# Patient Record
Sex: Female | Born: 1970 | Race: White | Hispanic: No | Smoking: Never smoker
Health system: Southern US, Community
[De-identification: ages and names within clinical notes are randomized; demographics above are authoritative.]

---

## 2001-07-29 ENCOUNTER — Inpatient Hospital Stay (HOSPITAL_COMMUNITY): Admission: AD | Admit: 2001-07-29 | Discharge: 2001-08-02 | Payer: Self-pay | Admitting: Obstetrics and Gynecology

## 2001-09-28 ENCOUNTER — Other Ambulatory Visit: Admission: RE | Admit: 2001-09-28 | Discharge: 2001-09-28 | Payer: Self-pay | Admitting: Obstetrics and Gynecology

## 2002-11-25 ENCOUNTER — Other Ambulatory Visit: Admission: RE | Admit: 2002-11-25 | Discharge: 2002-11-25 | Payer: Self-pay | Admitting: Obstetrics and Gynecology

## 2004-02-07 ENCOUNTER — Other Ambulatory Visit: Admission: RE | Admit: 2004-02-07 | Discharge: 2004-02-07 | Payer: Self-pay | Admitting: Obstetrics and Gynecology

## 2005-05-05 ENCOUNTER — Other Ambulatory Visit: Admission: RE | Admit: 2005-05-05 | Discharge: 2005-05-05 | Payer: Self-pay | Admitting: Obstetrics and Gynecology

## 2011-09-21 ENCOUNTER — Ambulatory Visit (INDEPENDENT_AMBULATORY_CARE_PROVIDER_SITE_OTHER): Payer: 59

## 2011-09-21 DIAGNOSIS — Z0000 Encounter for general adult medical examination without abnormal findings: Secondary | ICD-10-CM

## 2011-10-19 ENCOUNTER — Ambulatory Visit (INDEPENDENT_AMBULATORY_CARE_PROVIDER_SITE_OTHER): Payer: 59

## 2011-10-19 DIAGNOSIS — Z719 Counseling, unspecified: Secondary | ICD-10-CM

## 2011-11-03 ENCOUNTER — Telehealth: Payer: Self-pay

## 2011-11-03 NOTE — Telephone Encounter (Signed)
.  [facility] PT WOULD LIKE TO SPEAK WITH DR DOO OR HIS NURSE REGARDING HER MEDS PLEASE CALL [phone]

## 2011-11-04 NOTE — Telephone Encounter (Signed)
CALLED PT LMOM TO C/B

## 2011-11-06 NOTE — Telephone Encounter (Signed)
LMOM TO CB 

## 2011-11-07 NOTE — Telephone Encounter (Signed)
Sent unable to reach letter.

## 2011-11-07 NOTE — Telephone Encounter (Signed)
LMOM TO CB 

## 2011-11-13 ENCOUNTER — Telehealth: Payer: Self-pay

## 2011-11-13 NOTE — Telephone Encounter (Signed)
.  [facility] PT HAD SPOKEN WITH A CLINICAL NURSE THE OTHER DAY AND STILL HAVE QUESTIONS TO ASK PLEASE CALL [phone]

## 2011-11-15 NOTE — Telephone Encounter (Signed)
LMOM to CB. 

## 2011-11-18 NOTE — Telephone Encounter (Signed)
Spoke with pt who had just wanted to check and make sure that she could take OTC cold meds with the acyclovir she is on. Pt stated she had called the pharmacist and he told her it was safe.

## 2012-04-16 ENCOUNTER — Ambulatory Visit (INDEPENDENT_AMBULATORY_CARE_PROVIDER_SITE_OTHER): Payer: 59 | Admitting: Internal Medicine

## 2012-04-16 VITALS — BP 134/80 | HR 75 | Temp 98.7°F | Resp 16 | Ht 67.0 in | Wt 287.0 lb

## 2012-04-16 DIAGNOSIS — R3915 Urgency of urination: Secondary | ICD-10-CM

## 2012-04-16 DIAGNOSIS — B9689 Other specified bacterial agents as the cause of diseases classified elsewhere: Secondary | ICD-10-CM

## 2012-04-16 DIAGNOSIS — R102 Pelvic and perineal pain: Secondary | ICD-10-CM

## 2012-04-16 DIAGNOSIS — N949 Unspecified condition associated with female genital organs and menstrual cycle: Secondary | ICD-10-CM

## 2012-04-16 DIAGNOSIS — N760 Acute vaginitis: Secondary | ICD-10-CM

## 2012-04-16 LAB — POCT URINALYSIS DIPSTICK
Bilirubin, UA: NEGATIVE
Blood, UA: NEGATIVE
Glucose, UA: NEGATIVE
Ketones, UA: NEGATIVE
Leukocytes, UA: NEGATIVE
Nitrite, UA: POSITIVE
Protein, UA: NEGATIVE
Spec Grav, UA: >=1.03
Urobilinogen, UA: 0.2
pH, UA: 5.5

## 2012-04-16 LAB — POCT UA - MICROSCOPIC ONLY
Casts, Ur, LPF, POC: NEGATIVE
Crystals, Ur, HPF, POC: NEGATIVE
Mucus, UA: POSITIVE
WBC, Ur, HPF, POC: NEGATIVE
Yeast, UA: NEGATIVE

## 2012-04-16 LAB — POCT WET PREP WITH KOH
KOH Prep POC: NEGATIVE
Yeast Wet Prep HPF POC: NEGATIVE

## 2012-04-16 MED ORDER — FLUCONAZOLE 150 MG PO TABS: 150.0000 mg | ORAL_TABLET | Freq: Once | ORAL | Status: AC

## 2012-04-16 MED ORDER — CIPROFLOXACIN HCL 500 MG PO TABS: 500.0000 mg | ORAL_TABLET | Freq: Two times a day (BID) | ORAL | Status: DC

## 2012-04-16 MED ORDER — METRONIDAZOLE 500 MG PO TABS: 500.0000 mg | ORAL_TABLET | Freq: Two times a day (BID) | ORAL | Status: AC

## 2012-04-16 NOTE — Progress Notes (Signed)
Subjective:    Patient ID: Danielle Butler, female    DOB: [birth year], 41 y.o.   MRN: [id number]  HPIComplaining of pelvic discomfort with urinary urgency and some post void discomfort/no vaginal discharge but feels raw around the labia History of herpes on prophylaxis without new lesions Same partner for the last several months at least since last visit He travels and over the weekend before the symptoms started they had several episodes of intercourse which were prolonged This was her first weekend for a protective intercourse with this partner   Review of SystemsNo fever or chills No back pain No nausea or vomiting      Objective:   Physical Exam In no acute distress Abdomen exam benign Introitus clear though inner labia are hyperemic White vaginal discharge Cervical os clear and nontender to manipulation Uterus anterior and mildly tender No adnexal tenderness or masses       Results for orders placed in visit on 04/16/12  POCT UA - MICROSCOPIC ONLY      Component Value Range   WBC, Ur, HPF, POC neg     RBC, urine, microscopic 0-2     Bacteria, U Microscopic trace     Mucus, UA pos     Epithelial cells, urine per micros 0-1     Crystals, Ur, HPF, POC neg     Casts, Ur, LPF, POC neg'     Yeast, UA neg    POCT URINALYSIS DIPSTICK      Component Value Range   Color, UA orange     Clarity, UA clear     Glucose, UA neg     Bilirubin, UA neg     Ketones, UA neg     Spec Grav, UA >=1.030     Blood, UA neg     pH, UA 5.5     Protein, UA neg     Urobilinogen, UA 0.2     Nitrite, UA pos     Leukocytes, UA Negative     Results for orders placed in visit on 04/16/12  POCT UA - MICROSCOPIC ONLY      Component Value Range   WBC, Ur, HPF, POC neg     RBC, urine, microscopic 0-2     Bacteria, U Microscopic trace     Mucus, UA pos     Epithelial cells, urine per micros 0-1     Crystals, Ur, HPF, POC neg     Casts, Ur, LPF, POC neg'     Yeast, UA neg    POCT  URINALYSIS DIPSTICK      Component Value Range   Color, UA orange     Clarity, UA clear     Glucose, UA neg     Bilirubin, UA neg     Ketones, UA neg     Spec Grav, UA >=1.030     Blood, UA neg     pH, UA 5.5     Protein, UA neg     Urobilinogen, UA 0.2     Nitrite, UA pos     Leukocytes, UA Negative    POCT WET PREP WITH KOH      Component Value Range   Trichomonas, UA Negative     Clue Cells Wet Prep HPF POC 6-15     Epithelial Wet Prep HPF POC 4-6     Yeast Wet Prep HPF POC neg     Bacteria Wet Prep HPF POC 2+     RBC Wet Prep  HPF POC 3-8     WBC Wet Prep HPF POC 0-4     KOH Prep POC Negative      Assessment & Plan:  Problem #1 nitrite positive suggesting UTI in a woman with recurrent UTI Problem #2 nonspecific vaginitis Problem #3 unprotected intercourse with partner who travels 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 acyclovir (ZOVIRAX) 200 MG capsule    Sig: Take by mouth 2 (two) times daily.  . ciprofloxacin (CIPRO) 500 MG tablet    Sig: Take 1 tablet (500 mg total) by mouth 2 (two) times daily.    Dispense:  20 tablet    Refill:  0  . metroNIDAZOLE (FLAGYL) 500 MG tablet    Sig: Take 1 tablet (500 mg total) by mouth 2 (two) times daily.    Dispense:  14 tablet    Refill:  0  . fluconazole (DIFLUCAN) 150 MG tablet    Sig: Take 1 tablet (150 mg total) by mouth once.    Dispense:  1 tablet    Refill:  1   Ok to call For refill of acyclovir /She has his prescription Call with results

## 2012-04-19 ENCOUNTER — Telehealth: Payer: Self-pay

## 2012-04-19 NOTE — Telephone Encounter (Signed)
Pt would like to know if lab results from her visit on Friday are back in yet.  Best# [phone]

## 2012-04-20 ENCOUNTER — Telehealth: Payer: Self-pay

## 2012-04-20 NOTE — Telephone Encounter (Signed)
Pt states she is still having stomach pain after being on medication for uti since Friday, and would like to know when she will be feeling better or should she be concerned about her stomach pian.

## 2012-04-20 NOTE — Telephone Encounter (Signed)
Called patient to advise, she will finish her medications if she does not resolve completely she will call back.

## 2012-04-20 NOTE — Telephone Encounter (Signed)
Called her to advise she should be improving with this, needs to be seen if still painful. She is planning to go out of town this weekend and will come in today or tomorrow

## 2012-04-21 ENCOUNTER — Ambulatory Visit (INDEPENDENT_AMBULATORY_CARE_PROVIDER_SITE_OTHER): Payer: 59 | Admitting: Family Medicine

## 2012-04-21 VITALS — BP 128/74 | HR 68 | Temp 97.7°F | Resp 16 | Ht 66.0 in | Wt 289.0 lb

## 2012-04-21 DIAGNOSIS — N3090 Cystitis, unspecified without hematuria: Secondary | ICD-10-CM

## 2012-04-21 DIAGNOSIS — R109 Unspecified abdominal pain: Secondary | ICD-10-CM

## 2012-04-21 LAB — POCT URINALYSIS DIPSTICK
Blood, UA: NEGATIVE
Ketones, UA: NEGATIVE
Protein, UA: NEGATIVE
Spec Grav, UA: >=1.03
pH, UA: 6.5

## 2012-04-21 LAB — POCT CBC
Granulocyte percent: 68.3 %G (ref 37–80)
HCT, POC: 42 % (ref 37.7–47.9)
Hemoglobin: 13 g/dL (ref 12.2–16.2)
MCV: 88.9 fL (ref 80–97)
MID (cbc): 0.5 (ref 0–0.9)
Platelet Count, POC: 180 10*3/uL (ref 142–424)
RBC: 4.73 M/uL (ref 4.04–5.48)

## 2012-04-21 LAB — POCT UA - MICROSCOPIC ONLY
Casts, Ur, LPF, POC: NEGATIVE
Mucus, UA: POSITIVE
Yeast, UA: NEGATIVE

## 2012-04-21 LAB — POCT URINE PREGNANCY: Preg Test, Ur: NEGATIVE

## 2012-04-21 MED ORDER — NITROFURANTOIN MONOHYD MACRO 100 MG PO CAPS: 100.0000 mg | ORAL_CAPSULE | Freq: Two times a day (BID) | ORAL | Status: AC

## 2012-04-21 NOTE — Progress Notes (Signed)
[facility] [address] [place] [address] [phone]  Date:  04/21/2012   Name:  Danielle Butler   DOB:  March 18, 1971   MRN:  [id number]  PCP:  No primary provider on file.    Chief Complaint: Cystitis   History of Present Illness:  Danielle Butler is a 41 y.o. very pleasant female patient who presents with the following:  She was here on 7/26 with complaint of urinary and vaginal discomfort.  History of HSV.  She was treated for possible UTI and vaginitis with acyclovir, cipro, diflucan and flagyl.  She had a negative genprobe and negative urine culture.    Yesterday she noted lower abdominal and lower back pain, as well as some urinary pressure.  She has been drinking fluids.   No nausea or vomiting- except Friday she felt ill after taking her medication, she has had some diarrhea.  No fever.   Her menses are due soon- she wonders if this could be the source of her discomfort.  Her vaginal area still feels irritated- she has taken one diflucan and has one more to take in a few days.  She is mid- way through her flagyl and cipro.    LMP 03/26/12  There is no problem list on file for this patient.   No past medical history on file.  No past surgical history on file.  History  Substance Use Topics  . Smoking status: Never Smoker   . Smokeless tobacco: Not on file  . Alcohol Use: Not on file    No family history on file.  No Known Allergies  Medication list has been reviewed and updated.  Current Outpatient Prescriptions on File Prior to Visit  Medication Sig Dispense Refill  . acyclovir (ZOVIRAX) 200 MG capsule Take by mouth 2 (two) times daily.      . ciprofloxacin (CIPRO) 500 MG tablet Take 1 tablet (500 mg total) by mouth 2 (two) times daily.  20 tablet  0  . metroNIDAZOLE (FLAGYL) 500 MG tablet Take 1 tablet (500 mg total) by mouth 2 (two) times daily.  14 tablet  0    Review of Systems:  As per HPI- otherwise negative.   Physical  Examination: Filed Vitals:   04/21/12 0918  BP: 128/74  Pulse: 68  Temp: 97.7 F (36.5 C)  Resp: 16   Filed Vitals:   04/21/12 0918  Height: 5\' 6"  (1.676 m)  Weight: 289 lb (131.09 kg)   Body mass index is 46.65 kg/(m^2). Ideal Body Weight: Weight in (lb) to have BMI = 25: 154.6   GEN: WDWN, NAD, Non-toxic, A & O x 3, obese HEENT: Atraumatic, Normocephalic. Neck supple. No masses, No LAD. Ears and Nose: No external deformity. CV: RRR, No M/G/R. No JVD. No thrill. No extra heart sounds. PULM: CTA B, no wheezes, crackles, rhonchi. No retractions. No resp. distress. No accessory muscle use. ABD: S, NT, ND, +BS. No rebound. No HSM.  No CVA tenderness EXTR: No c/c/e NEURO Normal gait.  PSYCH: Normally interactive. Conversant. Not depressed or anxious appearing.  Calm demeanor.  Gu: normal external and internal exam.  No CMT, no adnexal tenderness.  No abnormal discharge.     Results for orders placed in visit on 04/21/12  POCT URINALYSIS DIPSTICK      Component Value Range   Color, UA dark yellow     Clarity, UA clear     Glucose, UA neg     Bilirubin,  UA neg     Ketones, UA neg     Spec Grav, UA >=1.030     Blood, UA neg     pH, UA 6.5     Protein, UA neg     Urobilinogen, UA 0.2     Nitrite, UA neg     Leukocytes, UA Negative    POCT UA - MICROSCOPIC ONLY      Component Value Range   WBC, Ur, HPF, POC 2-6     RBC, urine, microscopic 6-20     Bacteria, U Microscopic 3+     Mucus, UA pos     Epithelial cells, urine per micros 5-6     Crystals, Ur, HPF, POC calcium oxalate     Casts, Ur, LPF, POC neg     Yeast, UA neg    POCT URINE PREGNANCY      Component Value Range   Preg Test, Ur Negative    POCT CBC      Component Value Range   WBC 7.4  4.6 - 10.2 K/uL   Lymph, poc 1.9  0.6 - 3.4   POC LYMPH PERCENT 25.4  10 - 50 %L   MID (cbc) 0.5  0 - 0.9   POC MID % 6.3  0 - 12 %M   POC Granulocyte 5.1  2 - 6.9   Granulocyte percent 68.3  37 - 80 %G   RBC 4.73  4.04  - 5.48 M/uL   Hemoglobin 13.0  12.2 - 16.2 g/dL   HCT, POC 40.9  81.1 - 47.9 %   MCV 88.9  80 - 97 fL   MCH, POC 27.5  27 - 31.2 pg   MCHC 31.0 (*) 31.8 - 35.4 g/dL   RDW, POC 91.4     Platelet Count, POC 180  142 - 424 K/uL   MPV 11.5  0 - 99.8 fL    Assessment and Plan: 1. Bladder infection  POCT urinalysis dipstick, POCT UA - Microscopic Only, nitrofurantoin, macrocrystal-monohydrate, (MACROBID) 100 MG capsule, Urine culture  2. Abdominal  pain, other specified site  POCT urine pregnancy, POCT CBC   Koni continues to have bacteria in her urine.  Possible false negative urine culture?  Will D/C cipro and start macrobid, await repeat culture.  Normal CBC reassuring that she has not abdominal infection.  Let me know if not better within 2 days- Sooner if worse.     Abbe Amsterdam, MD

## 2012-04-21 NOTE — Patient Instructions (Signed)
Stop taking the cipro- we are going to use macrobid (nitrofuratoin) instead. Let me know if you are feeling worse or if you are not better in the next couple of days

## 2012-04-23 LAB — URINE CULTURE: Organism ID, Bacteria: NO GROWTH

## 2012-04-24 ENCOUNTER — Encounter: Payer: Self-pay | Admitting: Family Medicine

## 2012-04-24 ENCOUNTER — Telehealth: Payer: Self-pay

## 2012-04-24 NOTE — Telephone Encounter (Signed)
Message for dr copland  Pt returned call to say that she is feeling much better and thank you!

## 2012-05-11 ENCOUNTER — Telehealth: Payer: Self-pay

## 2012-05-11 NOTE — Telephone Encounter (Signed)
Danielle Butler is saying that dr copland had put her on macrobid, she said this works and has been in several times for her problem. It is going to be hard for her to come in again and would like to know what she should do? Would like a nurse to call her if possible [phone]

## 2012-05-11 NOTE — Telephone Encounter (Signed)
Assessment and Plan:  1.  Bladder infection  POCT urinalysis dipstick, POCT UA - Microscopic Only, nitrofurantoin, macrocrystal-monohydrate, (MACROBID) 100 MG capsule, Urine culture   2.  Abdominal pain, other specified site  POCT urine pregnancy, POCT CBC    Danielle Butler continues to have bacteria in her urine. Possible false negative urine culture? Will D/C cipro and start macrobid, await repeat culture. Normal CBC reassuring that she has not abdominal infection. Let me know if not better within 2 days- Sooner if worse.  Abbe Amsterdam, MD    From 04/21/12 office visit, please advise what to do now, patients symptoms have returned after finishing Macrobid Rx.

## 2012-05-12 NOTE — Telephone Encounter (Signed)
Please get details about what is going on now.

## 2012-05-12 NOTE — Telephone Encounter (Signed)
Pt reports that she really felt like the Macrobid was clearing up her infection but bf Sxs completely resolved she started her menstral cycle and it was hard for her to tell if she still had Sxs. Now that her cycle stopped a couple of days ago, she is definitely starting to feel the pressure and more freq urination. Urine still looks clear and light bc she has been drinking a lot of water. Pt was also started on Ortho Tri cyclin at same time by her GYN to control length of cycles. Pt thinks that she just didn't stay on the Macrobid quite long enough bc it was definitely helping. Dr Patsy Lager do you want to Rx another round? Also, we Rxd Diflucan last time bc she is prone to yeast infs w/Abxs.

## 2012-05-13 ENCOUNTER — Telehealth: Payer: Self-pay | Admitting: Family Medicine

## 2012-05-13 DIAGNOSIS — R350 Frequency of micturition: Secondary | ICD-10-CM

## 2012-05-13 MED ORDER — NITROFURANTOIN MONOHYD MACRO 100 MG PO CAPS: 100.0000 mg | ORAL_CAPSULE | Freq: Two times a day (BID) | ORAL | Status: AC

## 2012-05-13 MED ORDER — FLUCONAZOLE 150 MG PO TABS: 150.0000 mg | ORAL_TABLET | Freq: Once | ORAL | Status: AC

## 2012-05-13 NOTE — Telephone Encounter (Signed)
Called her back- I am so sorry that I did not get to call her back until today.  She again has urinary urgency and frequency.  Explained that her past urine cultures were both negative.  However, she did feel that the macrobid helped.  She would like to try taking this again.  This is ok, but explained that she does need to follow- up if her symptoms persist after this treatment.  I will send macrobid and diflucan to her drug store.    She is not having any nausea, vomiting, fever, or other worrisome symptoms.

## 2012-05-17 ENCOUNTER — Encounter: Payer: Self-pay | Admitting: Family Medicine

## 2012-06-18 ENCOUNTER — Telehealth: Payer: Self-pay

## 2012-06-18 ENCOUNTER — Other Ambulatory Visit: Payer: Self-pay | Admitting: Internal Medicine

## 2012-06-18 NOTE — Telephone Encounter (Signed)
Patient would like refill of    acyclovir (ZOVIRAX) 200 MG capsule [[id number]]    Best [phone]  [facility] [address], [place] - [address]

## 2012-06-18 NOTE — Telephone Encounter (Signed)
I have already sent this.

## 2012-06-19 NOTE — Telephone Encounter (Signed)
lmom that rx was sent in

## 2012-09-22 ENCOUNTER — Ambulatory Visit (INDEPENDENT_AMBULATORY_CARE_PROVIDER_SITE_OTHER): Payer: 59 | Admitting: Internal Medicine

## 2012-09-22 VITALS — BP 132/82 | HR 101 | Temp 98.4°F | Resp 17 | Ht 66.5 in | Wt 299.0 lb

## 2012-09-22 DIAGNOSIS — J019 Acute sinusitis, unspecified: Secondary | ICD-10-CM

## 2012-09-22 DIAGNOSIS — Z0000 Encounter for general adult medical examination without abnormal findings: Secondary | ICD-10-CM

## 2012-09-22 DIAGNOSIS — Z6841 Body Mass Index (BMI) 40.0 and over, adult: Secondary | ICD-10-CM

## 2012-09-22 DIAGNOSIS — E785 Hyperlipidemia, unspecified: Secondary | ICD-10-CM | POA: Insufficient documentation

## 2012-09-22 DIAGNOSIS — B009 Herpesviral infection, unspecified: Secondary | ICD-10-CM | POA: Insufficient documentation

## 2012-09-22 LAB — POCT CBC
Lymph, poc: 2.5 (ref 0.6–3.4)
MCH, POC: 30 pg (ref 27–31.2)
MCV: 94.1 fL (ref 80–97)
MID (cbc): 0.7 (ref 0–0.9)
Platelet Count, POC: 211 10*3/uL (ref 142–424)
RBC: 4.84 M/uL (ref 4.04–5.48)
WBC: 10.7 10*3/uL — AB (ref 4.6–10.2)

## 2012-09-22 LAB — LIPID PANEL
HDL: 62 mg/dL (ref >39)
LDL Cholesterol: 168 mg/dL — ABNORMAL HIGH (ref 0–99)
Total CHOL/HDL Ratio: 4.3 Ratio

## 2012-09-22 LAB — COMPREHENSIVE METABOLIC PANEL
ALT: 19 U/L (ref 0–35)
Alkaline Phosphatase: 48 U/L (ref 39–117)
Sodium: 139 mEq/L (ref 135–145)
Total Bilirubin: 0.4 mg/dL (ref 0.3–1.2)
Total Protein: 7.4 g/dL (ref 6.0–8.3)

## 2012-09-22 MED ORDER — AMOXICILLIN 500 MG PO CAPS: 1000.0000 mg | ORAL_CAPSULE | Freq: Two times a day (BID) | ORAL | Status: AC

## 2012-09-22 MED ORDER — ACYCLOVIR 200 MG PO CAPS: 400.0000 mg | ORAL_CAPSULE | Freq: Two times a day (BID) | ORAL | Status: DC

## 2012-09-22 NOTE — Progress Notes (Signed)
  Subjective:    Patient ID: Danielle Butler, female    DOB: 04/26/1971, 42 y.o.   MRN: [id number]  HPIhere for cpe Cough/posterior HA on 12/24 Now frontal HA with bloody nasal mucus  Doing well in general although unable to lose weight and has regained about 10 pounds  Dr Konrad Dolores antibios/continues with some problems with postcoital irritation/unsure of actual infection Immunizations reported as up to date Pap and mammogram done by GYN Family and social history stable/ Family History  Problem Relation Age of Onset  . Hypertension Mother   . Diabetes Father   . Hypertension Sister       Review of Systems  Constitutional: Negative for fever, chills, activity change, appetite change, fatigue and unexpected weight change.  HENT: Positive for dental problem. Negative for hearing loss, trouble swallowing, neck pain, neck stiffness and tinnitus.   Respiratory: Negative for cough, shortness of breath and wheezing.   Cardiovascular: Negative for chest pain, palpitations and leg swelling.  Gastrointestinal: Negative for nausea, abdominal pain, diarrhea and constipation.  Genitourinary: Positive for dyspareunia. Negative for frequency, hematuria and difficulty urinating.       Continues on acyclovir  self Rx prophylaxis 400 twice a day instead no outbreaks since her initial lesions  Musculoskeletal: Negative for back pain, joint swelling and gait problem.  Neurological: Negative for dizziness and headaches.  Hematological: Does not bruise/bleed easily.  Psychiatric/Behavioral: Negative for hallucinations, behavioral problems, sleep disturbance, self-injury and decreased concentration. The patient is not nervous/anxious.        Objective:   Physical Exam Vital signs-weight 299 pounds HEENT clear except for molar on the right decayed and some other minor dental problems Also has tender right maxillary to percussion with right nares purulent discharge No thyromegaly or  lymphadenopathy Heart regular without murmur Lungs clear Abdomen soft without organomegaly Extremities clear with full range of motion of joints Spine straight/straight leg raise negative Neurological intact Psychological stable      Assessment & Plan:  Annual exam Problem #1 BMI 47 Problem #2 hyperlipidemia-LDL 170 last year Problem #3 recurrent HSV Problem #4 acute sinusitis  Meds ordered this encounter  Medications  . acyclovir (ZOVIRAX) 200 MG capsule    Sig: Take 2 capsules (400 mg total) by mouth 2 (two) times daily.    Dispense:  360 capsule    Refill:  3  . amoxicillin (AMOXIL) 500 MG capsule    Sig: Take 2 capsules (1,000 mg total) by mouth 2 (two) times daily.    Dispense:  40 capsule    Refill:  0   routine labs Weight loss!!!!!

## 2012-10-01 ENCOUNTER — Encounter: Payer: Self-pay | Admitting: Internal Medicine

## 2013-07-10 ENCOUNTER — Telehealth: Payer: Self-pay

## 2013-07-10 DIAGNOSIS — B009 Herpesviral infection, unspecified: Secondary | ICD-10-CM

## 2013-07-10 NOTE — Telephone Encounter (Signed)
Pt requests refill on her acyclovier - states only one left for today Sees [place] Please call when sent in: [facility]  bf

## 2013-07-11 MED ORDER — ACYCLOVIR 200 MG PO CAPS: 400.0000 mg | ORAL_CAPSULE | Freq: Two times a day (BID) | ORAL | Status: DC

## 2013-07-11 NOTE — Telephone Encounter (Signed)
Sent to pharmacy 

## 2013-07-15 ENCOUNTER — Telehealth: Payer: Self-pay | Admitting: Radiology

## 2013-07-15 ENCOUNTER — Ambulatory Visit: Payer: 59

## 2013-07-15 ENCOUNTER — Ambulatory Visit (INDEPENDENT_AMBULATORY_CARE_PROVIDER_SITE_OTHER): Payer: 59 | Admitting: Internal Medicine

## 2013-07-15 VITALS — BP 130/90 | HR 73 | Temp 98.8°F | Resp 18 | Ht 66.5 in | Wt 306.0 lb

## 2013-07-15 DIAGNOSIS — S29019A Strain of muscle and tendon of unspecified wall of thorax, initial encounter: Secondary | ICD-10-CM

## 2013-07-15 DIAGNOSIS — R079 Chest pain, unspecified: Secondary | ICD-10-CM

## 2013-07-15 DIAGNOSIS — K219 Gastro-esophageal reflux disease without esophagitis: Secondary | ICD-10-CM

## 2013-07-15 DIAGNOSIS — S239XXA Sprain of unspecified parts of thorax, initial encounter: Secondary | ICD-10-CM

## 2013-07-15 MED ORDER — CYCLOBENZAPRINE HCL 10 MG PO TABS: 10.0000 mg | ORAL_TABLET | Freq: Every day | ORAL | Status: DC

## 2013-07-15 MED ORDER — OMEPRAZOLE 40 MG PO CPDR: 40.0000 mg | DELAYED_RELEASE_CAPSULE | Freq: Every day | ORAL | Status: DC

## 2013-07-15 MED ORDER — MELOXICAM 15 MG PO TABS: 15.0000 mg | ORAL_TABLET | Freq: Every day | ORAL | Status: DC

## 2013-07-15 NOTE — Telephone Encounter (Signed)
Called pt and advised I have not gotten request from pharm yet, but verified Ins ID and phone # w/pt and advised her that I will go ahead and proceed w/prior auth and call when get a decision. Completed PA on covermymeds.

## 2013-07-15 NOTE — Telephone Encounter (Signed)
Pharmacy [facility] is sending over prior authorization for the Prilosec. Patient wants you to call her about this.

## 2013-07-15 NOTE — Progress Notes (Signed)
This chart was scribed for Ellamae Sia, MD by Joaquin Music, ED Scribe. This patient was seen in room Room 11 and the patient's care was started at 9:08 AM  Subjective:    Patient ID: Danielle Butler, female    DOB: 04/24/1971, 42 y.o.   MRN: [id number]  HPI Danielle Butler is a 42 y.o. female who presents to the [facility] complaining of ongoing worsening chest and back pain. Pt states she believes her GERD  is "acting up again." She states she felt she was having a MI due to the pain. Pt has been taking Advil to alleviate the symptoms. Pt generally takes about 5 Advil's a day. Pt states she feels tachycardic when she begins to do her routine walking. This causes SOB. Pts back pain is in her upper mid back. Seemed to start 1 mo ago w/ moving a heavy stove  Reflux sxt postprand and recently worsening as she has been taking lots of advil   Patient Active Problem List   Diagnosis Date Noted  . Annual physical exam 09/22/2012  . Hyperlipidemia 09/22/2012  . HSV (herpes simplex virus) infection 09/22/2012  . BMI 45.0-49.9, adult 09/22/2012  Current outpatient prescriptions:acyclovir (ZOVIRAX) 200 MG capsule, Take 2 capsules (400 mg total) by mouth 2 (two) times daily., Disp: 360 capsule, Rfl: 1   Review of Systems  Constitutional: Negative for activity change, fatigue and unexpected weight change.  Respiratory: Negative for cough and wheezing.   Cardiovascular: Positive for chest pain. Negative for leg swelling.  Musculoskeletal: Positive for back pain.  Neurological: Negative for headaches.  Psychiatric/Behavioral: Negative for dysphoric mood.       Objective:   Physical Exam  Constitutional: She is oriented to person, place, and time. She appears well-developed and well-nourished.  Eyes: Conjunctivae are normal. Pupils are equal, round, and reactive to light.  Neck: Normal range of motion. Neck supple. No thyromegaly present.  Cardiovascular: Normal rate, regular rhythm and  normal heart sounds.   No murmur heard. Pulmonary/Chest: Effort normal and breath sounds normal.  Tender chest wall anteriorly L and R. Tender in posterior thoracic area muscles in general.  Lymphadenopathy:    She has no cervical adenopathy.  Neurological: She is alert and oriented to person, place, and time.  Psychiatric: She has a normal mood and affect. Her behavior is normal. Thought content normal.   [facility] reading (PRIMARY) by  Dr. Merla Riches NAD         Assessment & Plan:  Chest pain - Plan: DG Chest 2 View  Thoracic myofascial strain, initial encounter  GERD (gastroesophageal reflux disease)  Obesity, morbid, BMI 40.0-49.9  Meds ordered this encounter  Medications  . meloxicam (MOBIC) 15 MG tablet    Sig: Take 1 tablet (15 mg total) by mouth daily.    Dispense:  30 tablet    Refill:  0  . cyclobenzaprine (FLEXERIL) 10 MG tablet    Sig: Take 1 tablet (10 mg total) by mouth at bedtime.    Dispense:  30 tablet    Refill:  0  . omeprazole (PRILOSEC) 40 MG capsule    Sig: Take 1 capsule (40 mg total) by mouth daily.    Dispense:  30 capsule    Refill:  3   4w f/u

## 2013-07-18 NOTE — Telephone Encounter (Signed)
Notified pt that PA was approved through 07/15/14 and sent fax to pharmacy.

## 2013-07-27 ENCOUNTER — Other Ambulatory Visit: Payer: Self-pay | Admitting: Obstetrics and Gynecology

## 2013-07-27 DIAGNOSIS — R928 Other abnormal and inconclusive findings on diagnostic imaging of breast: Secondary | ICD-10-CM

## 2013-08-12 ENCOUNTER — Telehealth: Payer: Self-pay

## 2013-08-12 NOTE — Telephone Encounter (Signed)
Patient is not any better wants more medications - has no refills    [phone]

## 2013-08-12 NOTE — Telephone Encounter (Signed)
Patient is to follow up in 4 weeks, which is now. She is advised. She will come in this weekend. She is taking ibuprofen with the meloxicam, have advised not to take these together. She also states she is taking the meloxicam at 9 am the prilosec at 11am. Have advised her to change this, she is to take the prilosec first thing in the morning, then later take the meloxicam with her largest meal. She only has one meloxicam and one flexeril left, pended these please.

## 2013-08-15 ENCOUNTER — Ambulatory Visit (INDEPENDENT_AMBULATORY_CARE_PROVIDER_SITE_OTHER): Payer: 59 | Admitting: Internal Medicine

## 2013-08-15 VITALS — BP 138/88 | HR 100 | Temp 98.4°F | Resp 20 | Ht 62.25 in | Wt 304.2 lb

## 2013-08-15 DIAGNOSIS — K219 Gastro-esophageal reflux disease without esophagitis: Secondary | ICD-10-CM

## 2013-08-15 DIAGNOSIS — M5412 Radiculopathy, cervical region: Secondary | ICD-10-CM

## 2013-08-15 MED ORDER — MELOXICAM 15 MG PO TABS: 15.0000 mg | ORAL_TABLET | Freq: Every day | ORAL | Status: DC

## 2013-08-15 MED ORDER — CYCLOBENZAPRINE HCL 10 MG PO TABS: 10.0000 mg | ORAL_TABLET | Freq: Every day | ORAL | Status: DC

## 2013-08-15 NOTE — Patient Instructions (Signed)

## 2013-08-15 NOTE — Telephone Encounter (Signed)
Sent.  Follow up as planned 

## 2013-08-15 NOTE — Progress Notes (Signed)
  Subjective:  This chart was scribed for Ellamae Sia, MD by Arlan Organ, ED Scribe. This patient was seen in room Room 14  and the patient's care was started 6:23 PM.    Patient ID: Danielle Butler, female    DOB: 1971-09-08, 42 y.o.   MRN: [id number]  HPI HPI Comments: Danielle Butler is a 42 y.o. female who presents to [facility] seeking a follow up from her 07/15/13 visit. She states she is no longer tender in her upper chest, but now experiences discomfort in her back. Pt describes the discomfort as "burning". She says she lifts a large amount of heavy objects at work, and feels her pain may be related to the lifting. She also states she has recently been under a lot of stress, and feels this could be contributing to her symptoms as well. Overall, much improved.   Current outpatient prescriptions:acyclovir (ZOVIRAX) 200 MG capsule, Take 2 capsules (400 mg total) by mouth 2 (two) times daily., Disp: 360 capsule, Rfl: 1;  cyclobenzaprine (FLEXERIL) 10 MG tablet, Take 1 tablet (10 mg total) by mouth at bedtime., Disp: 30 tablet, Rfl: 2;  meloxicam (MOBIC) 15 MG tablet, Take 1 tablet (15 mg total) by mouth daily., Disp: 30 tablet, Rfl: 2 omeprazole (PRILOSEC) 40 MG capsule, Take 1 capsule (40 mg total) by mouth daily., Disp: 30 capsule, Rfl: 3  Review of Systems  Respiratory: Negative for chest tightness.   Cardiovascular: Negative for chest pain.  Musculoskeletal: Positive for back pain.  All other systems reviewed and are negative.    Objective:   Physical Exam  Nursing note and vitals reviewed. Constitutional: She is oriented to person, place, and time. She appears well-developed and well-nourished.  HENT:  Head: Normocephalic and atraumatic.  Eyes: EOM are normal. Pupils are equal, round, and reactive to light.  Neck: No thyromegaly present.  Tender to palp in trapez and paracerv muscles with good rom but pain on rot to L , tilt L  Cardiovascular: Normal rate.   Pulmonary/Chest:  Effort normal.  Lymphadenopathy:    She has no cervical adenopathy.  Neurological: She is alert and oriented to person, place, and time.  No sens losses hands  Skin: No rash noted.  Psychiatric: She has a normal mood and affect. Her behavior is normal.      Assessment & Plan:  Cervical radiculopathy GERD  Cont all meds//neck book for home PT/avoid reinjury/f/u 2 mos if needed Meds ordered this encounter  Medications  . cyclobenzaprine (FLEXERIL) 10 MG tablet    Sig: Take 1 tablet (10 mg total) by mouth at bedtime.    Dispense:  30 tablet    Refill:  2  . meloxicam (MOBIC) 15 MG tablet    Sig: Take 1 tablet (15 mg total) by mouth daily.    Dispense:  30 tablet    Refill:  2    -  omepr 40     I have completed the patient encounter in its entirety as documented by the scribe, with editing by me where necessary. Purva Vessell P. Merla Riches, M.D.

## 2013-08-16 ENCOUNTER — Ambulatory Visit
Admission: RE | Admit: 2013-08-16 | Discharge: 2013-08-16 | Disposition: A | Discharge status: Home / Self Care | Payer: 59 | Source: Ambulatory Visit | Attending: Obstetrics and Gynecology | Admitting: Obstetrics and Gynecology

## 2013-08-16 ENCOUNTER — Ambulatory Visit
Admission: RE | Admit: 2013-08-16 | Discharge: 2013-08-16 | Disposition: A | Discharge status: Home / Self Care | Payer: Self-pay | Source: Ambulatory Visit | Attending: Obstetrics and Gynecology | Admitting: Obstetrics and Gynecology

## 2013-08-16 DIAGNOSIS — R928 Other abnormal and inconclusive findings on diagnostic imaging of breast: Secondary | ICD-10-CM

## 2013-10-10 ENCOUNTER — Telehealth: Payer: Self-pay

## 2013-10-10 MED ORDER — MELOXICAM 15 MG PO TABS: 15.0000 mg | ORAL_TABLET | Freq: Every day | ORAL | Status: DC

## 2013-10-10 NOTE — Telephone Encounter (Signed)
I sent to the pharmacy

## 2013-10-10 NOTE — Telephone Encounter (Signed)
meloxicam (MOBIC) 15 MG tablet Refill - states she called the pharmacy   [phone]

## 2013-10-10 NOTE — Telephone Encounter (Signed)
RX request not in Rx pool.

## 2013-10-10 NOTE — Telephone Encounter (Signed)
VM not set up on cell # pt left. [facility]

## 2013-11-16 ENCOUNTER — Other Ambulatory Visit: Payer: Self-pay | Admitting: Internal Medicine

## 2014-03-26 ENCOUNTER — Ambulatory Visit (INDEPENDENT_AMBULATORY_CARE_PROVIDER_SITE_OTHER): Payer: 59 | Admitting: Internal Medicine

## 2014-03-26 DIAGNOSIS — Z0000 Encounter for general adult medical examination without abnormal findings: Secondary | ICD-10-CM

## 2014-03-26 DIAGNOSIS — B009 Herpesviral infection, unspecified: Secondary | ICD-10-CM

## 2014-03-26 DIAGNOSIS — E785 Hyperlipidemia, unspecified: Secondary | ICD-10-CM

## 2014-03-26 LAB — COMPREHENSIVE METABOLIC PANEL
ALT: 14 U/L (ref 0–35)
AST: 14 U/L (ref 0–37)
Albumin: 3.8 g/dL (ref 3.5–5.2)
Alkaline Phosphatase: 52 U/L (ref 39–117)
BUN: 13 mg/dL (ref 6–23)
CALCIUM: 9.4 mg/dL (ref 8.4–10.5)
CHLORIDE: 105 meq/L (ref 96–112)
CO2: 24 meq/L (ref 19–32)
CREATININE: 0.87 mg/dL (ref 0.50–1.10)
Glucose, Bld: 106 mg/dL — ABNORMAL HIGH (ref 70–99)
Potassium: 4 mEq/L (ref 3.5–5.3)
Sodium: 139 mEq/L (ref 135–145)
Total Bilirubin: 0.6 mg/dL (ref 0.2–1.2)
Total Protein: 6.6 g/dL (ref 6.0–8.3)

## 2014-03-26 LAB — CBC WITH DIFFERENTIAL/PLATELET
Basophils Absolute: 0 10*3/uL (ref 0.0–0.1)
Basophils Relative: 0 % (ref 0–1)
EOS PCT: 2 % (ref 0–5)
Eosinophils Absolute: 0.1 10*3/uL (ref 0.0–0.7)
HCT: 34.3 % — ABNORMAL LOW (ref 36.0–46.0)
Hemoglobin: 11.4 g/dL — ABNORMAL LOW (ref 12.0–15.0)
LYMPHS ABS: 1.7 10*3/uL (ref 0.7–4.0)
LYMPHS PCT: 26 % (ref 12–46)
MCH: 26.7 pg (ref 26.0–34.0)
MCHC: 33.2 g/dL (ref 30.0–36.0)
MCV: 80.3 fL (ref 78.0–100.0)
MONO ABS: 0.6 10*3/uL (ref 0.1–1.0)
Monocytes Relative: 9 % (ref 3–12)
Neutro Abs: 4.2 10*3/uL (ref 1.7–7.7)
Neutrophils Relative %: 63 % (ref 43–77)
Platelets: 154 10*3/uL (ref 150–400)
RBC: 4.27 MIL/uL (ref 3.87–5.11)
RDW: 16.4 % — ABNORMAL HIGH (ref 11.5–15.5)
WBC: 6.6 10*3/uL (ref 4.0–10.5)

## 2014-03-26 LAB — POCT GLYCOSYLATED HEMOGLOBIN (HGB A1C): Hemoglobin A1C: 5.6

## 2014-03-26 LAB — POCT URINALYSIS DIPSTICK
Bilirubin, UA: NEGATIVE
GLUCOSE UA: NEGATIVE
Ketones, UA: NEGATIVE
LEUKOCYTES UA: NEGATIVE
NITRITE UA: NEGATIVE
Spec Grav, UA: 1.02
UROBILINOGEN UA: 0.2
pH, UA: 5.5

## 2014-03-26 LAB — LIPID PANEL
Cholesterol: 225 mg/dL — ABNORMAL HIGH (ref 0–200)
HDL: 44 mg/dL (ref >39)
LDL Cholesterol: 154 mg/dL — ABNORMAL HIGH (ref 0–99)
Total CHOL/HDL Ratio: 5.1 Ratio
Triglycerides: 134 mg/dL (ref <150)
VLDL: 27 mg/dL (ref 0–40)

## 2014-03-26 LAB — TSH: TSH: 2.654 u[IU]/mL (ref 0.350–4.500)

## 2014-03-26 MED ORDER — ACYCLOVIR 200 MG PO CAPS: 400.0000 mg | ORAL_CAPSULE | Freq: Two times a day (BID) | ORAL | Status: DC

## 2014-03-26 NOTE — Progress Notes (Signed)
Subjective:    Patient ID: Danielle Butler, female    DOB: 05/28/1971, 43 y.o.   MRN: [id number]  HPI This chart was scribed for [name], MD by Phillis HaggisGabriella Gaje, ED Scribe. This patient was seen in room 13 and the patient's care was started at 9:42 AM.  HPI Comments: Danielle Butler is a 43 y.o. female who presents to the [facility] for cpe  Reports back neck pain that radiates to the upper shoulders, collarbones, and the front of the neck. See prior w/u Work posture makes it worse Reports central chest pain that hurts to palpation She reports that exercise and twisting motions worsen the pain Has been taking Tylenol for the pain which eases the pain Needs a refill of acyclovir, takes 200 mg every day---works well  To be married in sept--HS boyfriend--now works for her father at [employer]  Patient Active Problem List   Diagnosis Date Noted  . Annual physical exam 09/22/2012  . Hyperlipidemia 09/22/2012  . HSV (herpes simplex virus) infection 09/22/2012  . BMI 45.0-49.9, adult 09/22/2012   No past medical history on file. Past Surgical History  Procedure Laterality Date  . Cesarean section     No Known Allergies Prior to Admission medications   Medication Sig Start Date End Date Taking? Authorizing Provider  acyclovir (ZOVIRAX) 200 MG capsule Take 2 capsules (400 mg total) by mouth 2 (two) times daily. 07/11/13   Eleanore Delia ChimesE Egan, PA-C  cyclobenzaprine (FLEXERIL) 10 MG tablet Take 1 tablet (10 mg total) by mouth at bedtime. 08/15/13   Tonye Pearsonobert P Manvir Prabhu, MD  meloxicam (MOBIC) 15 MG tablet Take 1 tablet (15 mg total) by mouth daily. 10/10/13   Morrell RiddleSarah L Weber, PA-C  omeprazole (PRILOSEC) 40 MG capsule TAKE ONE CAPSULE BY MOUTH ONCE DAILY 11/16/13   Sondra Bargesyan M Dunn, PA-C   History   Social History  . Marital Status: Single    Spouse Name: N/A    Number of Children: N/A  . Years of Education: N/A   Occupational History  . Not on file.   Social History  Main Topics  . Smoking status: Never Smoker   . Smokeless tobacco: Not on file  . Alcohol Use: No  . Drug Use: No  . Sexual Activity: Yes    Birth Control/ Protection: None   Other Topics Concern  . Not on file   Social History Narrative  . No narrative on file   Review of Systems  Constitutional:       Per HPI, otherwise negative  HENT:       Per HPI, otherwise negative  Respiratory:       Per HPI, otherwise negative  Cardiovascular:       Per HPI, otherwise negative  Endocrine:       Negative aside from HPI  Genitourinary:       Neg aside from HPI   Musculoskeletal:       Per HPI, otherwise negative  Skin: Negative.   All other systems reviewed and are negative.      Objective:   Physical Exam  Nursing note and vitals reviewed. Constitutional: She is oriented to person, place, and time. She appears well-developed and well-nourished. No distress.  HENT:  Head: Normocephalic.  Right Ear: External ear normal.  Left Ear: External ear normal.  Nose: Nose normal.  Mouth/Throat: Oropharynx is clear and moist.  Eyes: Conjunctivae and EOM are normal. Pupils are equal, round, and reactive to  light.  Neck: Normal range of motion. Neck supple. No thyromegaly present.  Cardiovascular: Normal rate, regular rhythm, normal heart sounds and intact distal pulses.   No murmur heard. Pulmonary/Chest: Effort normal and breath sounds normal.  Abdominal: Soft. Bowel sounds are normal. She exhibits no distension and no mass. There is no tenderness. There is no rebound.  Fatty tuswelling LUQ 3x3cm and 2x2cm contiguous-  Musculoskeletal: Normal range of motion. She exhibits no edema and no tenderness.       Right lower leg: She exhibits no edema.       Left lower leg: She exhibits no edema.  Lymphadenopathy:    She has no cervical adenopathy.  Neurological: She is alert and oriented to person, place, and time. She has normal reflexes. No cranial nerve deficit.  Skin: Skin is warm and  dry.  Psychiatric: She has a normal mood and affect. Her behavior is normal. Judgment and thought content normal.   Wt Readings from Last 3 Encounters:  03/26/14 321 lb 9.6 oz (145.877 kg)  08/15/13 304 lb 3.2 oz (137.984 kg)  07/15/13 306 lb (138.801 kg)  BP 145/83  Pulse 85  Temp(Src) 98.5 F (36.9 C) (Oral)  Resp 20  Ht 5\' 6"  (1.676 m)  Wt 321 lb 9.6 oz (145.877 kg)  BMI 51.93 kg/m2  SpO2 100%      Assessment & Plan:  I have completed the patient encounter in its entirety as documented by the scribe, with editing by me where necessary. Charleston Hankin P. Merla Richesoolittle, M.D.  Morbid obesity - Plan: TSH, POCT glycosylated hemoglobin (Hb A1C)  Other and unspecified hyperlipidemia - Plan: CBC with Differential, Comprehensive metabolic panel, Lipid panel  Routine general medical examination at a health care facility - Plan: POCT urinalysis dipstick  HSV (herpes simplex virus) infection - Plan: acyclovir (ZOVIRAX) 200 MG capsule  Disc diet/exer/wt loss goal Pull paper chart for immun review Notify labs

## 2014-03-28 ENCOUNTER — Encounter: Payer: Self-pay | Admitting: Internal Medicine

## 2014-03-29 ENCOUNTER — Encounter: Payer: Self-pay | Admitting: Internal Medicine

## 2014-07-18 ENCOUNTER — Other Ambulatory Visit: Payer: Self-pay | Admitting: Obstetrics and Gynecology

## 2014-07-19 LAB — CYTOLOGY - PAP

## 2014-08-01 ENCOUNTER — Other Ambulatory Visit: Payer: Self-pay | Admitting: Obstetrics and Gynecology

## 2014-08-01 DIAGNOSIS — R928 Other abnormal and inconclusive findings on diagnostic imaging of breast: Secondary | ICD-10-CM

## 2014-08-10 ENCOUNTER — Ambulatory Visit
Admission: RE | Admit: 2014-08-10 | Discharge: 2014-08-10 | Disposition: A | Discharge status: Home / Self Care | Payer: 59 | Source: Ambulatory Visit | Attending: Obstetrics and Gynecology | Admitting: Obstetrics and Gynecology

## 2014-08-10 DIAGNOSIS — R928 Other abnormal and inconclusive findings on diagnostic imaging of breast: Secondary | ICD-10-CM

## 2014-10-06 ENCOUNTER — Telehealth: Payer: Self-pay

## 2014-10-06 DIAGNOSIS — B009 Herpesviral infection, unspecified: Secondary | ICD-10-CM

## 2014-10-06 NOTE — Telephone Encounter (Signed)
Pt needs refill on acyclovir. Please advise. CB # [phone]

## 2014-10-09 MED ORDER — ACYCLOVIR 200 MG PO CAPS: 400.0000 mg | ORAL_CAPSULE | Freq: Two times a day (BID) | ORAL | Status: DC

## 2014-10-09 NOTE — Telephone Encounter (Signed)
Sent RFs and notified pt. Advised that she should plan to come back in to see Dr Merla Richesoolittle before she runs out of omeprazole, but pt only takes this as needed. Pt agreed.

## 2014-11-11 ENCOUNTER — Ambulatory Visit (INDEPENDENT_AMBULATORY_CARE_PROVIDER_SITE_OTHER): Payer: 59 | Admitting: Internal Medicine

## 2014-11-11 VITALS — BP 132/83 | HR 79 | Temp 97.9°F | Resp 18 | Ht 66.5 in | Wt 302.0 lb

## 2014-11-11 DIAGNOSIS — R300 Dysuria: Secondary | ICD-10-CM

## 2014-11-11 LAB — POCT UA - MICROSCOPIC ONLY
CRYSTALS, UR, HPF, POC: NEGATIVE
Casts, Ur, LPF, POC: NEGATIVE
Mucus, UA: NEGATIVE
Yeast, UA: NEGATIVE

## 2014-11-11 LAB — POCT URINALYSIS DIPSTICK
BILIRUBIN UA: NEGATIVE
Glucose, UA: NEGATIVE
Ketones, UA: NEGATIVE
LEUKOCYTES UA: NEGATIVE
Nitrite, UA: NEGATIVE
Protein, UA: NEGATIVE
RBC UA: NEGATIVE
UROBILINOGEN UA: 0.2
pH, UA: 6

## 2014-11-11 MED ORDER — FLUCONAZOLE 150 MG PO TABS: 150.0000 mg | ORAL_TABLET | Freq: Once | ORAL | Status: AC

## 2014-11-12 MED ORDER — OMEPRAZOLE 40 MG PO CPDR: 40.0000 mg | DELAYED_RELEASE_CAPSULE | Freq: Every day | ORAL | Status: AC

## 2014-11-12 NOTE — Progress Notes (Signed)
   Subjective:    Patient ID: Danielle Butler, female    DOB: 10/03/1970, 44 y.o.   MRN: [id number]  HPI  Chief Complaint  Patient presents with  . Burning with urination----but no frequency urgency or small void     X 1 week  . Vaginal Discharge--- scant but with itching of labia //also with lots of itching in both groin areas with a noted rash     X 1 week  . Abdominal Pain--- this is a fullness or pressure feeling related to urination and without constipation or diarrhea, nausea or vomiting.     Real low, X 3-4 days  . Medication Refill----occasional GERD  See 7/15    Prilosec needed-takes irregularly    See notes on relationships 7/15--stable partner She has lost weight through vigorous exercise and diet control as discussed last summer Wt Readings from Last 3 Encounters:  11/11/14 302 lb (136.986 kg)  03/26/14 321 lb 9.6 oz (145.877 kg)  Working out 6 days a week--lots of sweating--vaginal itching and pressure and groin irritation gets worse during workouts  Review of Systems Noncontributory    Objective:   Physical Exam BP 132/83 mmHg  Pulse 79  Temp(Src) 97.9 F (36.6 C) (Oral)  Resp 18  Ht 5' 6.5" (1.689 m)  Wt 302 lb (136.986 kg)  BMI 48.02 kg/m2  SpO2 100%  LMP 10/23/2014 No acute distress Abdomen benign Positive intertrigo  Results for orders placed or performed in visit on 11/11/14  POCT UA - Microscopic Only  Result Value Ref Range   WBC, Ur, HPF, POC 0-2    RBC, urine, microscopic 0-1    Bacteria, U Microscopic trace    Mucus, UA neg    Epithelial cells, urine per micros 0-2    Crystals, Ur, HPF, POC neg    Casts, Ur, LPF, POC neg    Yeast, UA neg   POCT urinalysis dipstick  Result Value Ref Range   Color, UA yellow    Clarity, UA clear    Glucose, UA neg    Bilirubin, UA neg    Ketones, UA neg    Spec Grav, UA <=1.005    Blood, UA neg    pH, UA 6.0    Protein, UA neg    Urobilinogen, UA 0.2    Nitrite, UA neg    Leukocytes, UA Negative             Assessment & Plan:  Impression #1 tinea cruris/vaginitis Problem #2 GERD Problem #3 morbid obesity--making progress  Meds ordered this encounter  Medications  . fluconazole (DIFLUCAN) 150 MG tablet    Sig: Take 1 tablet (150 mg total) by mouth once. Once a week for 3 weeks    Dispense:  3 tablet    Refill:  0  . omeprazole (PRILOSEC) 40 MG capsule    Sig: Take 1 capsule (40 mg total) by mouth daily.    Dispense:  30 capsule    Refill:  5

## 2015-02-12 ENCOUNTER — Telehealth: Payer: Self-pay

## 2015-02-12 NOTE — Telephone Encounter (Signed)
Pt was seen here in February and received fluconazole (DIFLUCAN) 150 MG tablet [[id number]][[id number]] from Dr. Merla Richesoolittle. She would like a refill. I told her this was unlikely because this script was given months ago and she would need to be seen again. She would still like the message sent through. Please advise at [phone]

## 2015-02-13 NOTE — Telephone Encounter (Signed)
If patient is having vaginal sx it is likely separate from visit when she was seen in February. Needs another OV.

## 2015-02-13 NOTE — Telephone Encounter (Signed)
Spoke with pt, advised message. Pt understood. 

## 2015-04-07 ENCOUNTER — Telehealth: Payer: Self-pay

## 2015-04-07 DIAGNOSIS — B009 Herpesviral infection, unspecified: Secondary | ICD-10-CM

## 2015-04-07 MED ORDER — ACYCLOVIR 200 MG PO CAPS: 400.0000 mg | ORAL_CAPSULE | Freq: Two times a day (BID) | ORAL | Status: DC

## 2015-04-07 NOTE — Telephone Encounter (Signed)
Patient was here with her mother this morning. She wanted to check on the status of her acyclovir (ZOVIRAX) 200 MG capsule [[id number]][[id number]] refill. She uses [facility] [address] [place]. They told her that she had NO refills left. She needs a new refill.   Best number to reach her is [phone].

## 2015-04-07 NOTE — Telephone Encounter (Signed)
Informed patient that her RX had been sent to her pharmacy.

## 2015-04-07 NOTE — Telephone Encounter (Signed)
Meds ordered this encounter  Medications  . acyclovir (ZOVIRAX) 200 MG capsule    Sig: Take 2 capsules (400 mg total) by mouth 2 (two) times daily.    Dispense:  360 capsule    Refill:  1

## 2015-08-20 ENCOUNTER — Encounter: Payer: Self-pay | Admitting: Internal Medicine

## 2015-10-13 ENCOUNTER — Telehealth: Payer: Self-pay

## 2015-10-13 DIAGNOSIS — B009 Herpesviral infection, unspecified: Secondary | ICD-10-CM

## 2015-10-13 NOTE — Telephone Encounter (Signed)
Pt is needing her medication refilled for herpes [phone]

## 2015-10-15 MED ORDER — ACYCLOVIR 200 MG PO CAPS: 400.0000 mg | ORAL_CAPSULE | Freq: Two times a day (BID) | ORAL | Status: AC

## 2015-10-15 NOTE — Telephone Encounter (Signed)
30 day supply sent. Needs office visit with Dr. Merla Riches.

## 2019-10-27 ENCOUNTER — Other Ambulatory Visit: Payer: Self-pay | Admitting: Obstetrics and Gynecology

## 2019-10-27 DIAGNOSIS — R928 Other abnormal and inconclusive findings on diagnostic imaging of breast: Secondary | ICD-10-CM

## 2019-11-08 ENCOUNTER — Ambulatory Visit
Admission: RE | Admit: 2019-11-08 | Discharge: 2019-11-08 | Disposition: A | Discharge status: Home / Self Care | Payer: 59 | Source: Ambulatory Visit | Attending: Obstetrics and Gynecology | Admitting: Obstetrics and Gynecology

## 2019-11-08 ENCOUNTER — Ambulatory Visit: Payer: Self-pay

## 2019-11-08 ENCOUNTER — Other Ambulatory Visit: Payer: Self-pay

## 2019-11-08 DIAGNOSIS — R928 Other abnormal and inconclusive findings on diagnostic imaging of breast: Secondary | ICD-10-CM

## 2020-03-08 IMAGING — MG MM DIGITAL DIAGNOSTIC UNILAT*R* W/ TOMO W/ CAD
6 series · 6 of 18 positions shown · non-contrast
Comparison: Previous exam(s).

CLINICAL DATA: Possible asymmetry in the upper right breast in the
oblique projection of a recent screening mammogram. The patient
reports a 55 lb weight loss over the past year.

EXAM:
DIGITAL DIAGNOSTIC UNILATERAL RIGHT MAMMOGRAM WITH CAD AND TOMO

[R MLO synth-2D (1 of 2)]
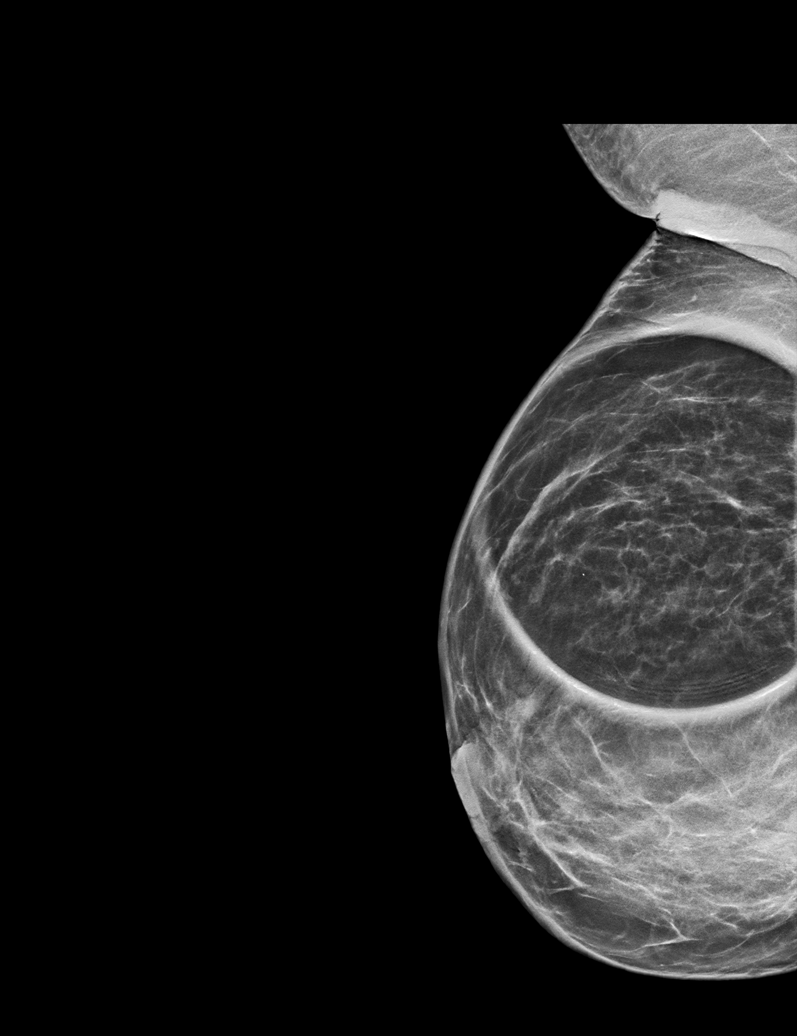

[R MLO synth-2D (2 of 2)]
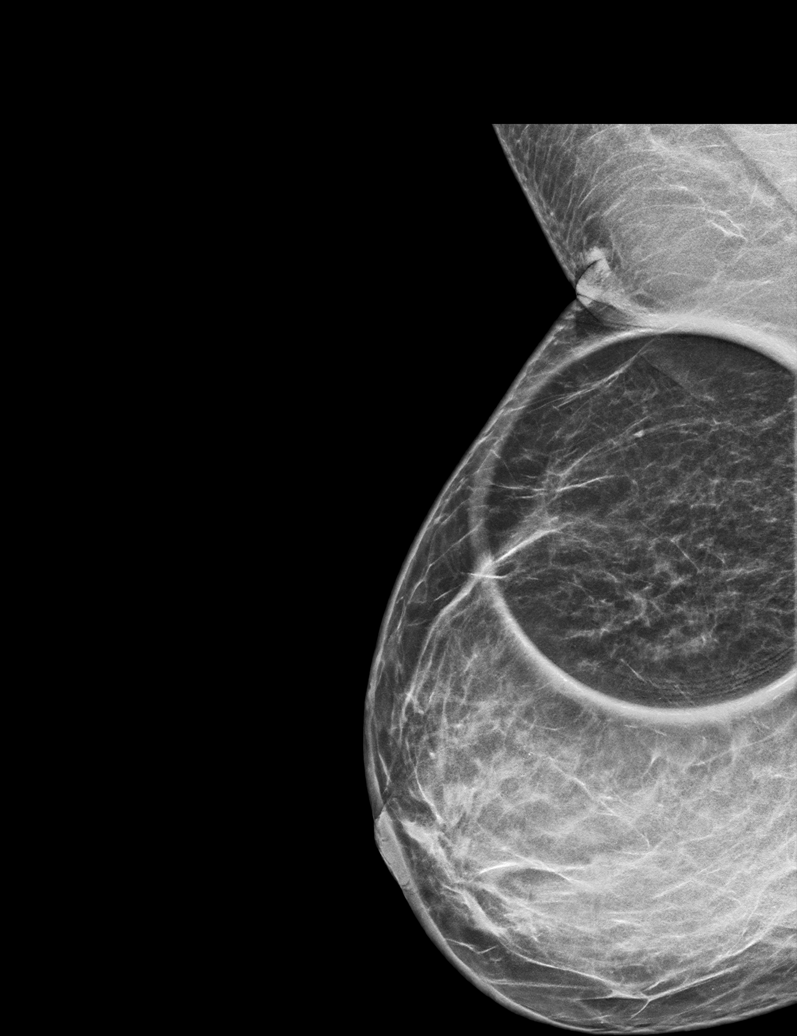

[R ML synth-2D]
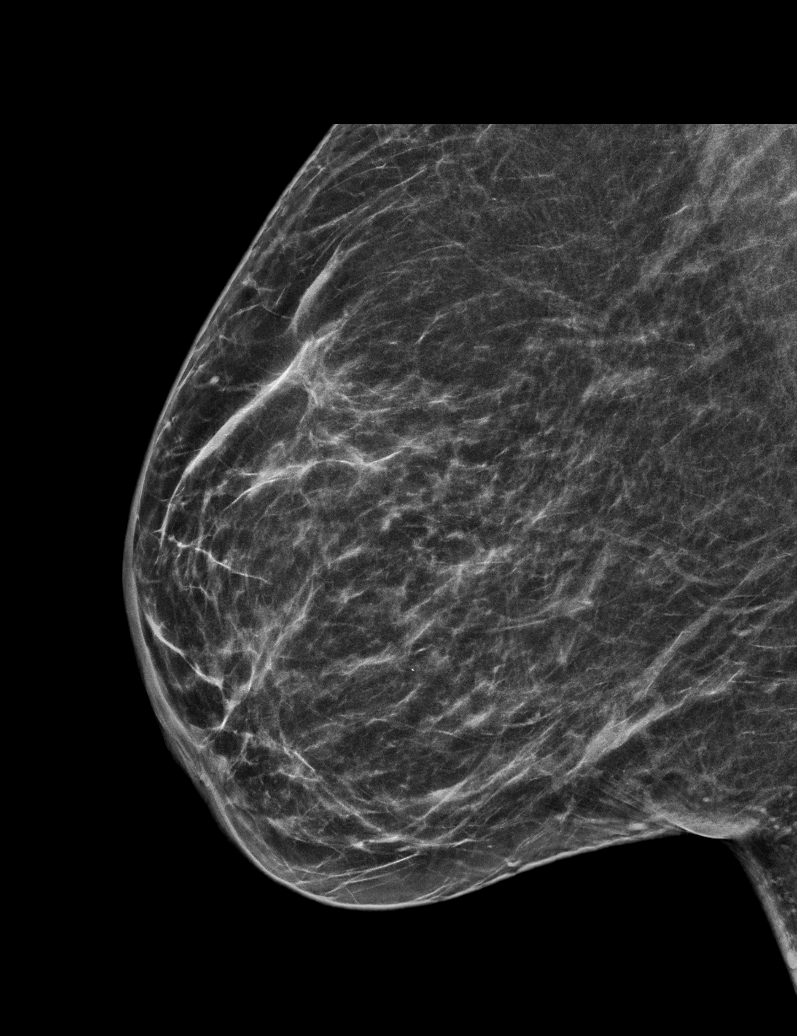

[R MLO tomo (1 of 2) · tomo slice 23/46.0]
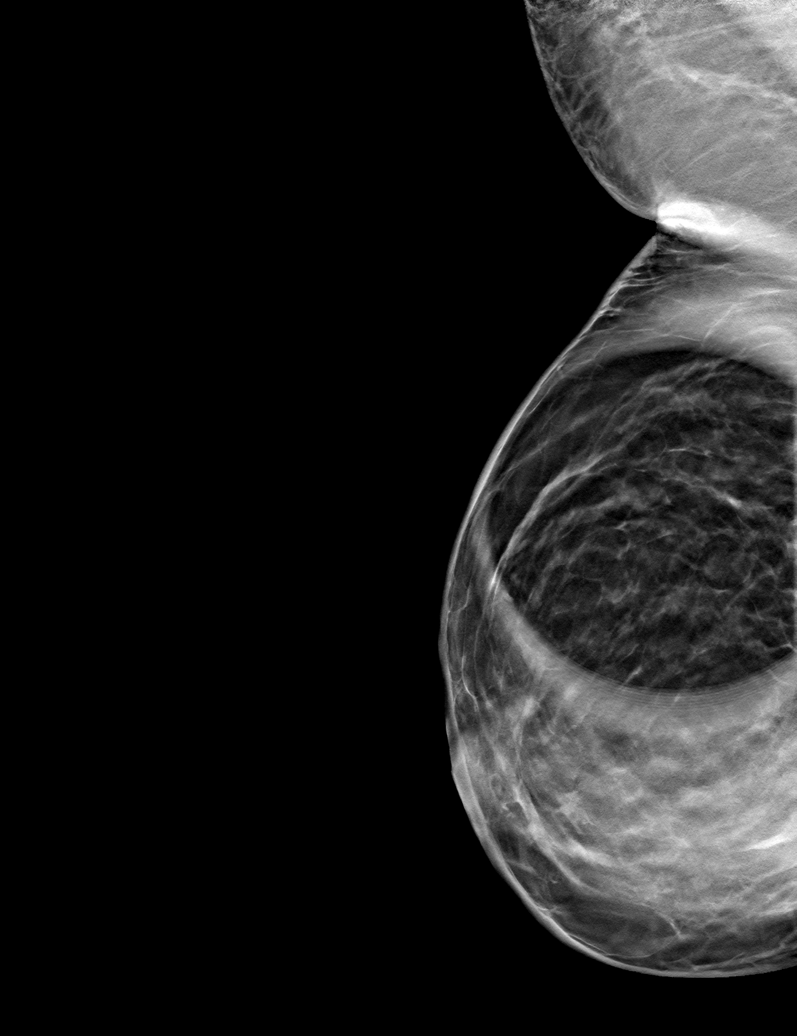

[R ML tomo · tomo slice 29/56.0]
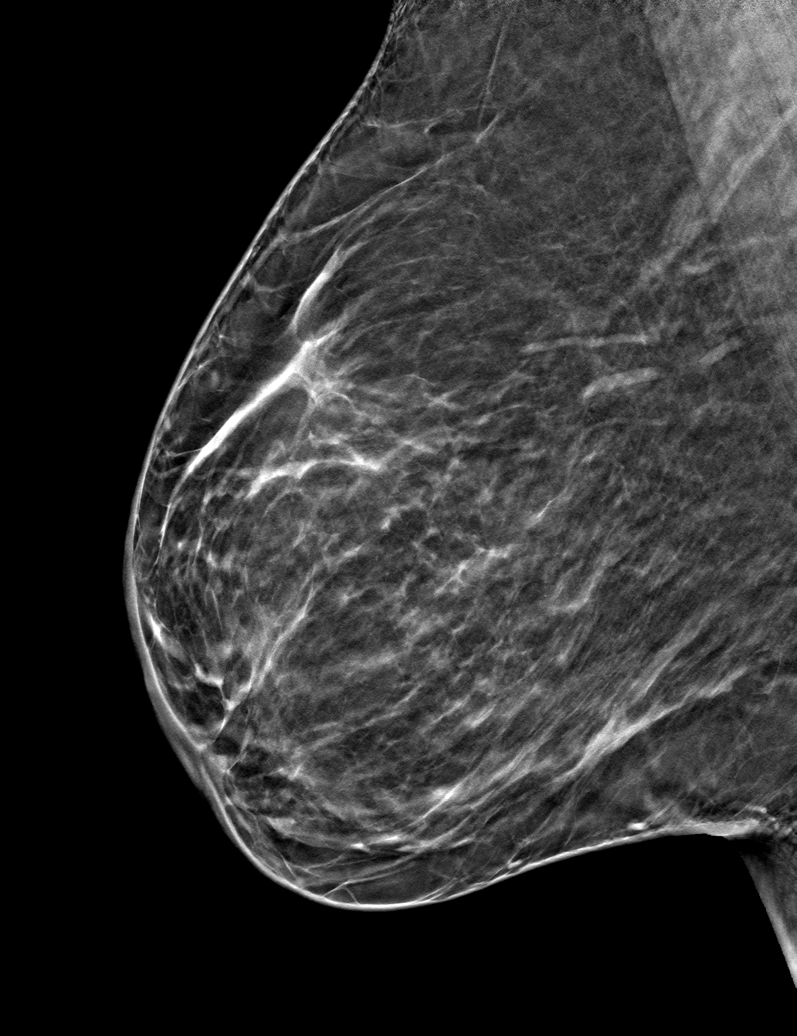

[R MLO tomo (2 of 2) · tomo slice 25/49.0]
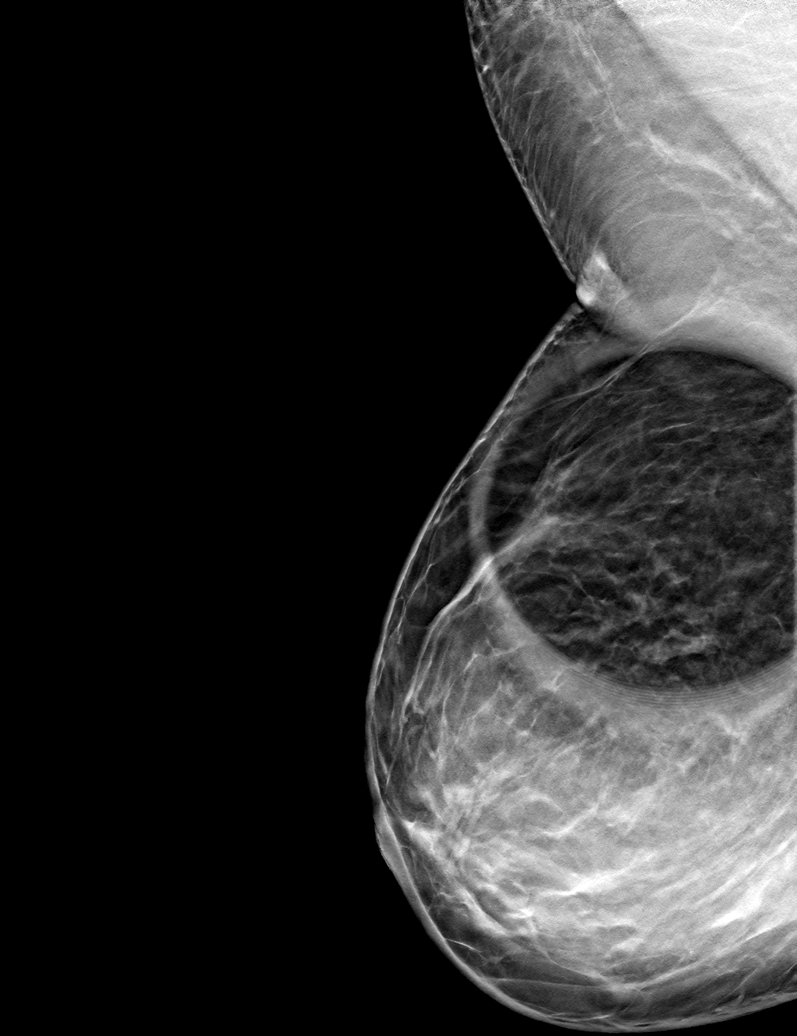

[6 of 18 positions shown; findings below may reference images not displayed]

ACR Breast Density Category b: There are scattered areas of
fibroglandular density.
FINDINGS: 3D tomographic and 2D generated true lateral and spot compression
oblique views of the right breast demonstrate normal appearing
fibroglandular tissue at the location of recently suspected
asymmetry, unchanged compared to previous examinations.

Mammographic images were processed with CAD.
IMPRESSION: No evidence of malignancy. The recently suspected right breast the
asymmetry was close apposition of normal breast tissue.

RECOMMENDATION:
Bilateral screening mammogram in 1 year.

I have discussed the findings and recommendations with the patient.
If applicable, a reminder letter will be sent to the patient
regarding the next appointment.

BI-RADS CATEGORY  1: Negative.
